# Patient Record
Sex: Female | Born: 1985 | Race: Black or African American | Hispanic: No | Marital: Single | Smoking: Never smoker
Health system: Southern US, Community
[De-identification: ages and names within clinical notes are randomized; demographics above are authoritative.]

---
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
## 2006-07-03 ENCOUNTER — Encounter: Payer: Self-pay | Admitting: Internal Medicine

## 2006-07-05 ENCOUNTER — Encounter: Payer: Self-pay | Admitting: Internal Medicine

## 2007-08-05 LAB — CONVERTED CEMR LAB: Pap Smear: NORMAL

## 2007-11-11 ENCOUNTER — Ambulatory Visit: Payer: Self-pay | Admitting: Internal Medicine

## 2007-11-11 DIAGNOSIS — L509 Urticaria, unspecified: Secondary | ICD-10-CM

## 2007-11-11 DIAGNOSIS — L708 Other acne: Secondary | ICD-10-CM

## 2007-11-11 DIAGNOSIS — J309 Allergic rhinitis, unspecified: Secondary | ICD-10-CM | POA: Insufficient documentation

## 2009-02-09 ENCOUNTER — Ambulatory Visit: Payer: Self-pay | Admitting: Internal Medicine

## 2009-02-10 ENCOUNTER — Ambulatory Visit: Payer: Self-pay | Admitting: Internal Medicine

## 2009-02-11 ENCOUNTER — Telehealth: Payer: Self-pay | Admitting: *Deleted

## 2009-02-11 ENCOUNTER — Encounter: Payer: Self-pay | Admitting: Internal Medicine

## 2009-06-30 ENCOUNTER — Ambulatory Visit: Payer: Self-pay | Admitting: Internal Medicine

## 2009-06-30 DIAGNOSIS — L568 Other specified acute skin changes due to ultraviolet radiation: Secondary | ICD-10-CM

## 2009-07-27 ENCOUNTER — Encounter: Payer: Self-pay | Admitting: Internal Medicine

## 2009-07-27 LAB — CONVERTED CEMR LAB
Basophils Absolute: 0.1 10*3/uL (ref 0.0–0.1)
Basophils Relative: 1.2 % (ref 0.0–3.0)
Eosinophils Absolute: 0.8 10*3/uL — ABNORMAL HIGH (ref 0.0–0.7)
Eosinophils Relative: 17.8 % — ABNORMAL HIGH (ref 0.0–5.0)
HCT: 42.7 % (ref 36.0–46.0)
Hemoglobin: 14 g/dL (ref 12.0–15.0)
IgE (Immunoglobulin E), Serum: 80.9 intl units/mL (ref 0.0–180.0)
Lymphocytes Relative: 29.9 % (ref 12.0–46.0)
Lymphs Abs: 1.3 10*3/uL (ref 0.7–4.0)
MCHC: 32.8 g/dL (ref 30.0–36.0)
MCV: 83.7 fL (ref 78.0–100.0)
Monocytes Absolute: 0.3 10*3/uL (ref 0.1–1.0)
Monocytes Relative: 7.7 % (ref 3.0–12.0)
Neutro Abs: 1.9 10*3/uL (ref 1.4–7.7)
Neutrophils Relative %: 43.4 % (ref 43.0–77.0)
Platelets: 221 10*3/uL (ref 150.0–400.0)
RBC: 5.09 M/uL (ref 3.87–5.11)
RDW: 14 % (ref 11.5–14.6)
WBC: 4.3 10*3/uL — ABNORMAL LOW (ref 4.5–10.5)

## 2009-07-29 ENCOUNTER — Telehealth (INDEPENDENT_AMBULATORY_CARE_PROVIDER_SITE_OTHER): Payer: Self-pay | Admitting: *Deleted

## 2009-08-20 ENCOUNTER — Ambulatory Visit: Payer: Self-pay | Admitting: Internal Medicine

## 2010-03-15 NOTE — Letter (Signed)
Summary: Health Exam Form/[facility]  Health Exam Form/[facility]   Imported By: Lanelle Bal 02/24/2009 10:58:57  _____________________________________________________________________  External Attachment:    Type:   Image     Comment:   External Document

## 2010-03-15 NOTE — Assessment & Plan Note (Signed)
Summary: ALLERGIES PER MOM ///kp   Vital Signs:  Patient profile:   25 year old female Height:      64.5 inches Weight:      114 pounds BMI:     19.34 O2 Sat:      95 % on Room air Pulse rate:   75 / minute BP sitting:   102 / 68  (left arm) Cuff size:   regular  Vitals Entered By: Reynaldo Minium CMA (Jun 30, 2009 10:46 AM)  O2 Flow:  Room air  Primary Provider/Referring Provider:  Dondra Spry DO   History of Present Illness: Jun 30, 2009- 24 yoF seen courtesy of Dr Artist Pais for allergy evaluation. She had been in school in [place] and coam up for this visit, but is living in [place] now. Since her teens she had noted occasional nasal congestion, sometimes with pressure frontal headache, itching eyes sniffling and nose blowing. This is more pronounced in Spring and Fall, and especially in the last 2 years. Zyrtec-D helps. Fluticasone spray gave nosebleeds. Denies sasthma, sinusitis. Triggers include animals, dust and pollen. No ENT surgery. She is more concerned about photosensitivity causing swelling and blistering of lips. This is also more evident in past 2 years and makes her reluctant to go out in sun. She say Dr Londell Moh Dermat[facility] and has tried to use lip balms with sunblocker. There is hx of eczema in the winter. Denies swelling of throat or extension into the mouth or eyes.  Current Medications (verified): 1)  Multivitamins  Tabs (Multiple Vitamin) .... Take 1 Tablet By Mouth Once A Day 2)  Ortho Tri-Cyclen Lo 0.025 Mg Tabs (Norgestimate-Ethinyl Estradiol) .... Once Daily 3)  Zyrtec-D Allergy & Congestion 5-120 Mg Xr12h-Tab (Cetirizine-Pseudoephedrine) .... Take 1 By Mouth Once Daily  Allergies (verified): No Known Drug Allergies  Past History:  Family History: Last updated: 06/30/2009 Hypertension - MGM, aunt Parents living  Social History: Last updated: 06/30/2009 Student - [employer] studying education- recent graduate Living in apartment, roomates, no pets.  Carpet and central air. Never Smoked Alcohol use-2 a month Drug use-no Single  Risk Factors: Smoking Status: never (11/11/2007)  Past Medical History: Allergic rhinitis Acne  Hx of Eczema Photosensitivity- lips  Past Surgical History: Wisdom teeth  Family History: Hypertension - MGM, aunt Parents living  Social History: Student - [employer] studying education- recent graduate Living in apartment, roomates, no pets. Carpet and central air. Never Smoked Alcohol use-2 a month Drug use-no Single  Review of Systems       The patient complains of nasal congestion/difficulty breathing through nose, sneezing, and itching.  The patient denies shortness of breath with activity, shortness of breath at rest, productive cough, non-productive cough, coughing up blood, chest pain, irregular heartbeats, acid heartburn, indigestion, loss of appetite, weight change, abdominal pain, difficulty swallowing, sore throat, tooth/dental problems, headaches, ear ache, anxiety, depression, hand/feet swelling, joint stiffness or pain, rash, change in color of mucus, and fever.    Physical Exam  Additional Exam:  General: A/Ox3; pleasant and cooperative, NAD, wdwn SKIN: no rash, lesions NODES: no lymphadenopathy HEENT: [place]/AT, EOM- WNL, Conjuctivae- clear, PERRLA, TM-WNL, Nose- clear but sniffing, Throat- clear and wnl, Mallampati  II, mucosa clear NECK: Supple w/ fair ROM, JVD- none, normal carotid impulses w/o bruits Thyroid- normal to palpation CHEST: Clear to P&A HEART: RRR, no m/g/r heard ABDOMEN: Soft and nl; nml bowel sounds; no organomegaly or masses noted QIO:NGEX, nl pulses, no edema  NEURO: Grossly intact to observation  Impression & Recommendations:  Problem # 1:  ACUTE DERMATITIS DUE TO SOLAR RADIATION (ICD-692.72) Not clear if this is photoactivation of herpes labialis, photo activation of urticaria, a localized lupus response, or something else. It is not present now for  me to see. Apparently her very experienced dermatologist wasn't sure about it.  We discussed barrier creams, broad brimmed hats, and possibility that it began with her BCP- so maybe she could change brand/ method. The following medications were removed from the medication list:    Xyzal 5 Mg Tabs (Levocetirizine dihydrochloride) ..... One by mouth once daily  Problem # 2:  ALLERGIC RHINITIS (ICD-477.9)  We will send Allergy profile, consider skin testing. Discussed dust precautions. Retry lower dose flonase once daily. Add otc decongestant if needed. The following medications were removed from the medication list:    Xyzal 5 Mg Tabs (Levocetirizine dihydrochloride) ..... One by mouth once daily    Fluticasone Propionate 50 Mcg/act Susp (Fluticasone propionate) .Marland Kitchen... 2 sprays each nostril once daily    Azelastine Hcl 137 Mcg/spray Soln (Azelastine hcl) .Marland Kitchen... 2 sprays two times a day as needed Her updated medication list for this problem includes:    Sudafed Pe Maximum Strength 10 Mg Tabs (Phenylephrine hcl) .Marland Kitchen... 1 two times a day as needed decongestant    Flonase 50 Mcg/act Susp (Fluticasone propionate) .Marland Kitchen... 1 spray each nostril once daily  Medications Added to Medication List This Visit: 1)  Zyrtec-d Allergy & Congestion 5-120 Mg Xr12h-tab (Cetirizine-pseudoephedrine) .... Take 1 by mouth once daily 2)  Sudafed Pe Maximum Strength 10 Mg Tabs (Phenylephrine hcl) .Marland Kitchen.. 1 two times a day as needed decongestant 3)  Flonase 50 Mcg/act Susp (Fluticasone propionate) .Marland Kitchen.. 1 spray each nostril once daily  Other Orders: Consultation Level III ([address]) TLB-CBC Platelet - w/Differential (85025-CBCD) T-Allergy Profile Region II-DC, [place], [place], [place], [place] [phone])  Patient Instructions: 1)  Please schedule a follow-up appointment in 1 month. 2)  Sun protection- PABA free lip creams, broad brimmed hat etc 3)  Try otc decongestant Sudafed-PE as needed for stuffiness. 4)  May use this along with Zyrtec- D, but  not at same time to avoid overdoing the stimulant effect of decongestant. Consider taking the Zyrtec-D at bedtime, and SudafedPE in the morning. 5)  Retry Flonase/ fluticasone, 1 puff each nostril, every night at bedtime. Aim "for the eye". Prescriptions: FLONASE 50 MCG/ACT SUSP (FLUTICASONE PROPIONATE) 1 spray each nostril once daily  #1 x prn   Entered and Authorized by:   Waymon Budge MD   Signed by:   Waymon Budge MD on 06/30/2009   Method used:   Historical   RxID:   [id number]

## 2010-03-15 NOTE — Assessment & Plan Note (Signed)
Summary: 1 month/apc   Primary Provider/Referring Provider:  Dondra Spry DO  CC:  1 month follow up visit-allergies..  History of Present Illness: Jun 30, 2009- 24 yoF seen courtesy of Dr Artist Pais for allergy evaluation. She had been in school in [place] and coam up for this visit, but is living in [place] now. Since her teens she had noted occasional nasal congestion, sometimes with pressure frontal headache, itching eyes sniffling and nose blowing. This is more pronounced in Spring and Fall, and especially in the last 2 years. Zyrtec-D helps. Fluticasone spray gave nosebleeds. Denies sasthma, sinusitis. Triggers include animals, dust and pollen. No ENT surgery. She is more concerned about photosensitivity causing swelling and blistering of lips. This is also more evident in past 2 years and makes her reluctant to go out in sun. She say Dr Londell Moh Dermatology and has tried to use lip balms with sunblocker. There is hx of eczema in the winter. Denies swelling of throat or extension into the mouth or eyes.  August 20, 2009 Allergic rhinitis, Solar dermatitis-lips She has avoided sustained sun exposure, using beach umbrella at pool, hat. So far she hasn't tested herself to see if sun exposure would still cause the painful reaction on her lips. No hx of herpes infection or tendency to "fever blisters" Stil wakes with nasal congestion. Now that she is back from [place],visitng parent's home this weekend, dog exposure is making her worse. After 1 week of flonase using  just 1 squirt, she again got nose bleeds- it did help rhinitis  before that. Zyrtec D taken in the AM would work until [employer], then wear off. Sudafed-PE didn't work. Exposure to her dog at parent's home = definitie trigger for nasal congestion.   Preventive Screening-Counseling & Management  Alcohol-Tobacco     Smoking Status: never  Current Medications (verified): 1)  Multivitamins  Tabs (Multiple Vitamin) .... Take 1 Tablet By Mouth  Once A Day 2)  Ortho Tri-Cyclen Lo 0.025 Mg Tabs (Norgestimate-Ethinyl Estradiol) .... Once Daily 3)  Zyrtec-D Allergy & Congestion 5-120 Mg Xr12h-Tab (Cetirizine-Pseudoephedrine) .... Take 1 By Mouth Once Daily 4)  Sudafed Pe Maximum Strength 10 Mg Tabs (Phenylephrine Hcl) .Marland Kitchen.. 1 Two Times A Day As Needed Decongestant 5)  Flonase 50 Mcg/act Susp (Fluticasone Propionate) .Marland Kitchen.. 1 Spray Each Nostril Once Daily 6)  Biotin 1000 Mcg Tabs (Biotin) .... Take 1 By Mouth Once Daily  Allergies (verified): No Known Drug Allergies  Past History:  Past Medical History: Last updated: 06/30/2009 Allergic rhinitis Acne  Hx of Eczema Photosensitivity- lips  Past Surgical History: Last updated: 06/30/2009 Wisdom teeth  Family History: Last updated: 06/30/2009 Hypertension - MGM, aunt Parents living  Social History: Last updated: 06/30/2009 Student - [employer] studying education- recent graduate Living in apartment, roomates, no pets. Carpet and central air. Never Smoked Alcohol use-2 a month Drug use-no Single  Risk Factors: Smoking Status: never (08/20/2009)  Review of Systems      See HPI       The patient complains of nasal congestion/difficulty breathing through nose and sneezing.  The patient denies shortness of breath with activity, shortness of breath at rest, productive cough, non-productive cough, coughing up blood, chest pain, irregular heartbeats, acid heartburn, indigestion, loss of appetite, weight change, abdominal pain, difficulty swallowing, sore throat, tooth/dental problems, and headaches.    Vital Signs:  Patient profile:   25 year old female Height:      64.5 inches Weight:      115  pounds BMI:     19.51 O2 Sat:      100 % on Room air Pulse rate:   75 / minute BP sitting:   108 / 62  (right arm) Cuff size:   regular  Vitals Entered By: Reynaldo Minium CMA (August 20, 2009 2:10 PM)  O2 Flow:  Room air CC: 1 month follow up visit-allergies.   Physical  Exam  Additional Exam:  General: A/Ox3; pleasant and cooperative, NAD, wdwn SKIN: no rash, lesions NODES: no lymphadenopathy HEENT: [place]/AT, EOM- WNL, Conjuctivae- clear, PERRLA, TM-WNL, Nose- clear but sniffing, Throat- clear and wnl, Mallampati  II, mucosa clear NECK: Supple w/ fair ROM, JVD- none, normal carotid impulses w/o bruits Thyroid- normal to palpation CHEST: Clear to P&A HEART: RRR, no m/g/r heard ABDOMEN: Soft and nl; nml bowel sounds; no organomegaly or masses noted NWG:NFAO, nl pulses, no edema  NEURO: Grossly intact to observation      Impression & Recommendations:  Problem # 1:  ACUTE DERMATITIS DUE TO SOLAR RADIATION (ICD-692.72) We discussed ddx of solar dermatitis/ urticaria, maybe potentiated by her BCPs. She will talk with her GYN about alternatives to systemic hormones. Nobody in family with porphyria that she knows of. Plan: Continue sun screen prevention. If blisters predominate, suggestive of sunlight triggered herpes labialis- then treatment with an antiviral might help- perhaps topical zovirax  Problem # 2:  ALLERGIC RHINITIS (ICD-477.9)  Epistaxis would likely limit any nasal steroid. Will try two times a day allegra- d, with nasalcrom The following medications were removed from the medication list:    Sudafed Pe Maximum Strength 10 Mg Tabs (Phenylephrine hcl) .Marland Kitchen... 1 two times a day as needed decongestant Her updated medication list for this problem includes:    Flonase 50 Mcg/act Susp (Fluticasone propionate) .Marland Kitchen... 1 spray each nostril once daily    Nasalcrom 5.2 Mg/act Aers (Cromolyn sodium) .Marland Kitchen... 1-2 pufss each nostril qid as needed  Medications Added to Medication List This Visit: 1)  Fexofenadine-pseudoephedrine 60-120 Mg Xr12h-tab (Fexofenadine-pseudoephedrine) .Marland Kitchen.. 1 two times a day as needed 2)  Biotin 1000 Mcg Tabs (Biotin) .... Take 1 by mouth once daily 3)  Nasalcrom 5.2 Mg/act Aers (Cromolyn sodium) .Marland Kitchen.. 1-2 pufss each nostril qid as  needed  Other Orders: Est. Patient Level IV (13086)  Patient Instructions: 1)  Please schedule a follow-up appointment in 3 months. 2)  Ask your Gyn about other methods so you can be off systemic hormones at least for a few months this summer to see how you do. 3)  Try otc antihistamine- decongestant instead of Zyrtec D: 4)  Allegra/fexofenadine -D 12 hour, twice daily if needed 5)  Try otc nasal spray Nasalcrom/ cromolyn 6)  .................................................................................... 7)  Solar dermatitis/ Solar urticaria 8)  Solar exacerbation of herpes/ Herpes labialis

## 2010-03-15 NOTE — Progress Notes (Signed)
Summary: RESULTS  Phone Note Call from Patient Call back at [phone]   Caller: Patient Call For: YOUNG Summary of Call: NEED TO TALK TO NURSE ABOUT RESULTS Initial call taken by: Rickard Patience,  July 29, 2009 3:15 PM  Follow-up for Phone Call        gave pt results but she has more questions and request to speak with katie--pt aware will forward to [facility] to call her back   Philipp Deputy CMA  July 29, 2009 4:10 PM    Spoke with pt; aware of labs in detail; states that usinf Flonase has made nose bleed again and Sudafed PE did not help; please advise.Reynaldo Minium CMA  August 02, 2009 11:45 AM   Additional Follow-up for Phone Call Additional follow up Details #1::        For different chemistry, unlikely to cause nosebleeds: Try otc Nasalcrom/ cromolyn, a nonsteroidal antiinflammatory nasal spray. Take it as dierected on box. This is available at most drug stores.  I will discuss options with her when i see her in July. Additional Follow-up by: Waymon Budge MD,  August 02, 2009 1:32 PM    Additional Follow-up for Phone Call Additional follow up Details #2::    Spoke with pt and notified of the above recs per Dr Maple Hudson.  Pt verbalized understanding.  She states will keep rov with Dr Maple Hudson in July to discuss this further. Follow-up by: Vernie Murders,  August 02, 2009 1:43 PM

## 2010-03-15 NOTE — Miscellaneous (Signed)
Summary: TB Test/[facility].  TB Test/[facility].   Imported By: Lanelle Bal 02/24/2009 11:02:21  _____________________________________________________________________  External Attachment:    Type:   Image     Comment:   External Document

## 2010-03-15 NOTE — Letter (Signed)
Summary: [facility] Results Follow Up Letter  [facility]  [address]   [place], [place] [address]   Phone: [phone]  Fax: [phone]    07/27/2009 MRN: [id number]  Jenna Perez [address] [place], [place]  [address]  Dear Ms. Rowland Lathe,  We have received the results from your recent tests and have been unable to contact you.  Please call our office at [phone] so that Dr.Young or his nurse may review the results with you.    Thank you,        Nature conservation officer Pulmonary Division

## 2014-02-03 ENCOUNTER — Emergency Department (HOSPITAL_BASED_OUTPATIENT_CLINIC_OR_DEPARTMENT_OTHER): Payer: No Typology Code available for payment source

## 2014-02-03 ENCOUNTER — Encounter (HOSPITAL_BASED_OUTPATIENT_CLINIC_OR_DEPARTMENT_OTHER): Payer: Self-pay | Admitting: Emergency Medicine

## 2014-02-03 ENCOUNTER — Emergency Department (HOSPITAL_BASED_OUTPATIENT_CLINIC_OR_DEPARTMENT_OTHER)
Admission: EM | Admit: 2014-02-03 | Discharge: 2014-02-03 | Disposition: A | Discharge status: Home / Self Care | Payer: No Typology Code available for payment source | Attending: Emergency Medicine | Admitting: Emergency Medicine

## 2014-02-03 DIAGNOSIS — Y92410 Unspecified street and highway as the place of occurrence of the external cause: Secondary | ICD-10-CM | POA: Diagnosis not present

## 2014-02-03 DIAGNOSIS — S161XXA Strain of muscle, fascia and tendon at neck level, initial encounter: Secondary | ICD-10-CM | POA: Insufficient documentation

## 2014-02-03 DIAGNOSIS — Y9389 Activity, other specified: Secondary | ICD-10-CM | POA: Insufficient documentation

## 2014-02-03 DIAGNOSIS — S0990XA Unspecified injury of head, initial encounter: Secondary | ICD-10-CM | POA: Diagnosis not present

## 2014-02-03 DIAGNOSIS — S161XXD Strain of muscle, fascia and tendon at neck level, subsequent encounter: Secondary | ICD-10-CM

## 2014-02-03 DIAGNOSIS — S199XXA Unspecified injury of neck, initial encounter: Secondary | ICD-10-CM | POA: Diagnosis present

## 2014-02-03 DIAGNOSIS — S59912A Unspecified injury of left forearm, initial encounter: Secondary | ICD-10-CM | POA: Diagnosis not present

## 2014-02-03 DIAGNOSIS — Y998 Other external cause status: Secondary | ICD-10-CM | POA: Diagnosis not present
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
## 2014-02-03 NOTE — Discharge Instructions (Signed)
Continue taking your ibuprofen as prescribed.   Cervical Sprain A cervical sprain is an injury in the neck in which the strong, fibrous tissues (ligaments) that connect your neck bones stretch or tear. Cervical sprains can range from mild to severe. Severe cervical sprains can cause the neck vertebrae to be unstable. This can lead to damage of the spinal cord and can result in serious nervous system problems. The amount of time it takes for a cervical sprain to get better depends on the cause and extent of the injury. Most cervical sprains heal in 1 to 3 weeks. CAUSES  Severe cervical sprains may be caused by:   Contact sport injuries (such as from football, rugby, wrestling, hockey, auto racing, gymnastics, diving, martial arts, or boxing).   Motor vehicle collisions.   Whiplash injuries. This is an injury from a sudden forward and backward whipping movement of the head and neck.  Falls.  Mild cervical sprains may be caused by:   Being in an awkward position, such as while cradling a telephone between your ear and shoulder.   Sitting in a chair that does not offer proper support.   Working at a poorly Marketing executivedesigned computer station.   Looking up or down for long periods of time.  SYMPTOMS   Pain, soreness, stiffness, or a burning sensation in the front, back, or sides of the neck. This discomfort may develop immediately after the injury or slowly, 24 hours or more after the injury.   Pain or tenderness directly in the middle of the back of the neck.   Shoulder or upper back pain.   Limited ability to move the neck.   Headache.   Dizziness.   Weakness, numbness, or tingling in the hands or arms.   Muscle spasms.   Difficulty swallowing or chewing.   Tenderness and swelling of the neck.  DIAGNOSIS  Most of the time your health care provider can diagnose a cervical sprain by taking your history and doing a physical exam. Your health care provider will ask about  previous neck injuries and any known neck problems, such as arthritis in the neck. X-rays may be taken to find out if there are any other problems, such as with the bones of the neck. Other tests, such as a CT scan or MRI, may also be needed.  TREATMENT  Treatment depends on the severity of the cervical sprain. Mild sprains can be treated with rest, keeping the neck in place (immobilization), and pain medicines. Severe cervical sprains are immediately immobilized. Further treatment is done to help with pain, muscle spasms, and other symptoms and may include:  Medicines, such as pain relievers, numbing medicines, or muscle relaxants.   Physical therapy. This may involve stretching exercises, strengthening exercises, and posture training. Exercises and improved posture can help stabilize the neck, strengthen muscles, and help stop symptoms from returning.  HOME CARE INSTRUCTIONS   Put ice on the injured area.   Put ice in a plastic bag.   Place a towel between your skin and the bag.   Leave the ice on for 15-20 minutes, 3-4 times a day.   If your injury was severe, you may have been given a cervical collar to wear. A cervical collar is a two-piece collar designed to keep your neck from moving while it heals.  Do not remove the collar unless instructed by your health care provider.  If you have long hair, keep it outside of the collar.  Ask your health care provider  before making any adjustments to your collar. Minor adjustments may be required over time to improve comfort and reduce pressure on your chin or on the back of your head.  Ifyou are allowed to remove the collar for cleaning or bathing, follow your health care provider's instructions on how to do so safely.  Keep your collar clean by wiping it with mild soap and water and drying it completely. If the collar you have been given includes removable pads, remove them every 1-2 days and hand wash them with soap and water. Allow  them to air dry. They should be completely dry before you wear them in the collar.  If you are allowed to remove the collar for cleaning and bathing, wash and dry the skin of your neck. Check your skin for irritation or sores. If you see any, tell your health care provider.  Do not drive while wearing the collar.   Only take over-the-counter or prescription medicines for pain, discomfort, or fever as directed by your health care provider.   Keep all follow-up appointments as directed by your health care provider.   Keep all physical therapy appointments as directed by your health care provider.   Make any needed adjustments to your workstation to promote good posture.   Avoid positions and activities that make your symptoms worse.   Warm up and stretch before being active to help prevent problems.  SEEK MEDICAL CARE IF:   Your pain is not controlled with medicine.   You are unable to decrease your pain medicine over time as planned.   Your activity level is not improving as expected.  SEEK IMMEDIATE MEDICAL CARE IF:   You develop any bleeding.  You develop stomach upset.  You have signs of an allergic reaction to your medicine.   Your symptoms get worse.   You develop new, unexplained symptoms.   You have numbness, tingling, weakness, or paralysis in any part of your body.  MAKE SURE YOU:   Understand these instructions.  Will watch your condition.  Will get help right away if you are not doing well or get worse. Document Released: 11/27/2006 Document Revised: 02/04/2013 Document Reviewed: 08/07/2012 [facility] Patient Information 2015 [employer], [place]. This information is not intended to replace advice given to you by your health care provider. Make sure you discuss any questions you have with your health care provider.
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
